# Patient Record
Sex: Male | Born: 1990 | Race: Black or African American | Hispanic: No | Marital: Single | Smoking: Current every day smoker
Health system: Southern US, Community
[De-identification: ages and names within clinical notes are randomized; demographics above are authoritative.]

---
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
## 2008-01-18 ENCOUNTER — Emergency Department (HOSPITAL_COMMUNITY): Admission: EM | Admit: 2008-01-18 | Discharge: 2008-01-18 | Payer: Self-pay | Admitting: Emergency Medicine

## 2008-04-07 ENCOUNTER — Encounter: Payer: Self-pay | Admitting: Orthopedic Surgery

## 2008-04-07 ENCOUNTER — Emergency Department (HOSPITAL_COMMUNITY): Admission: EM | Admit: 2008-04-07 | Discharge: 2008-04-07 | Payer: Self-pay | Admitting: Emergency Medicine

## 2008-04-10 ENCOUNTER — Ambulatory Visit: Payer: Self-pay | Admitting: Orthopedic Surgery

## 2008-04-10 DIAGNOSIS — S62319A Displaced fracture of base of unspecified metacarpal bone, initial encounter for closed fracture: Secondary | ICD-10-CM | POA: Insufficient documentation

## 2008-05-19 ENCOUNTER — Encounter: Payer: Self-pay | Admitting: Orthopedic Surgery

## 2008-12-09 ENCOUNTER — Emergency Department (HOSPITAL_COMMUNITY): Admission: EM | Admit: 2008-12-09 | Discharge: 2008-12-09 | Payer: Self-pay | Admitting: Emergency Medicine

## 2010-09-29 ENCOUNTER — Ambulatory Visit: Payer: Self-pay | Admitting: Orthopedic Surgery

## 2010-09-29 DIAGNOSIS — S62309A Unspecified fracture of unspecified metacarpal bone, initial encounter for closed fracture: Secondary | ICD-10-CM | POA: Insufficient documentation

## 2010-09-29 DIAGNOSIS — IMO0002 Reserved for concepts with insufficient information to code with codable children: Secondary | ICD-10-CM | POA: Insufficient documentation

## 2010-10-14 ENCOUNTER — Encounter: Payer: Self-pay | Admitting: Orthopedic Surgery

## 2011-01-01 ENCOUNTER — Emergency Department: Payer: Self-pay | Admitting: Unknown Physician Specialty

## 2011-01-04 NOTE — Letter (Signed)
Summary: History Form  History Form   Imported By: Jacklynn Ganong 10/04/2010 08:32:05  _____________________________________________________________________  External Attachment:    Type:   Image     Comment:   External Document

## 2011-01-04 NOTE — Letter (Signed)
Summary: *Orthopedic No Show Letter  [facility]  [address]  [place], [place] [address]   Phone: [phone]  Fax: [phone]      10/14/2010   Kerwin Augustus [address] [place]  [address]   Dear Mr. Dearing,   Our records indicate that you missed your scheduled appointment with Dr. Beaulah Corin on 10/14/2010.   Please contact this office to reschedule your appointment as soon as possible.  It is important that you keep your scheduled appointments with your physician, so we can provide you the best care possible.        Sincerely,   Dr. Terrance Mass, MD [facility] Phone [phone]
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
## 2013-02-13 ENCOUNTER — Emergency Department (HOSPITAL_COMMUNITY)
Admission: EM | Admit: 2013-02-13 | Discharge: 2013-02-13 | Disposition: A | Discharge status: Home / Self Care | Payer: No Typology Code available for payment source | Attending: Emergency Medicine | Admitting: Emergency Medicine

## 2013-02-13 ENCOUNTER — Emergency Department (HOSPITAL_COMMUNITY): Payer: No Typology Code available for payment source

## 2013-02-13 ENCOUNTER — Encounter (HOSPITAL_COMMUNITY): Payer: Self-pay

## 2013-02-13 DIAGNOSIS — S298XXA Other specified injuries of thorax, initial encounter: Secondary | ICD-10-CM | POA: Insufficient documentation

## 2013-02-13 DIAGNOSIS — F17200 Nicotine dependence, unspecified, uncomplicated: Secondary | ICD-10-CM | POA: Insufficient documentation

## 2013-02-13 DIAGNOSIS — S025XXA Fracture of tooth (traumatic), initial encounter for closed fracture: Secondary | ICD-10-CM | POA: Insufficient documentation

## 2013-02-13 DIAGNOSIS — Y92410 Unspecified street and highway as the place of occurrence of the external cause: Secondary | ICD-10-CM | POA: Insufficient documentation

## 2013-02-13 DIAGNOSIS — Y9389 Activity, other specified: Secondary | ICD-10-CM | POA: Insufficient documentation

## 2013-02-13 DIAGNOSIS — S8000XA Contusion of unspecified knee, initial encounter: Secondary | ICD-10-CM | POA: Insufficient documentation

## 2013-02-13 DIAGNOSIS — S46909A Unspecified injury of unspecified muscle, fascia and tendon at shoulder and upper arm level, unspecified arm, initial encounter: Secondary | ICD-10-CM | POA: Insufficient documentation

## 2013-02-13 MED ORDER — ACETAMINOPHEN-CODEINE #3 300-30 MG PO TABS: 1.0000 | ORAL_TABLET | Freq: Four times a day (QID) | ORAL | Status: DC | PRN

## 2013-02-13 MED ORDER — MELOXICAM 7.5 MG PO TABS: ORAL_TABLET | ORAL | Status: DC
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
## 2013-02-13 NOTE — ED Notes (Signed)
Pa bryant to see pt.  

## 2013-02-13 NOTE — ED Notes (Signed)
Pt returned from xray
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
## 2013-02-13 NOTE — ED Notes (Signed)
Pt given ginger ale before discharge and friend at bedside given 2nd cup of water. Ambulated without difficulty to lobby.

## 2013-02-13 NOTE — ED Provider Notes (Signed)
Medical screening examination/treatment/procedure(s) were performed by non-physician practitioner and as supervising physician I was immediately available for consultation/collaboration.   Joya Gaskins, MD 02/13/13 2043

## 2014-04-01 ENCOUNTER — Emergency Department: Payer: Self-pay | Admitting: Emergency Medicine

## 2016-08-09 ENCOUNTER — Encounter (HOSPITAL_COMMUNITY): Payer: Self-pay | Admitting: *Deleted

## 2016-08-09 ENCOUNTER — Emergency Department (HOSPITAL_COMMUNITY)
Admission: EM | Admit: 2016-08-09 | Discharge: 2016-08-09 | Disposition: A | Discharge status: Home / Self Care | Payer: Self-pay | Attending: Emergency Medicine | Admitting: Emergency Medicine

## 2016-08-09 ENCOUNTER — Emergency Department (HOSPITAL_COMMUNITY): Payer: Self-pay

## 2016-08-09 DIAGNOSIS — Y9364 Activity, baseball: Secondary | ICD-10-CM | POA: Insufficient documentation

## 2016-08-09 DIAGNOSIS — Y999 Unspecified external cause status: Secondary | ICD-10-CM | POA: Insufficient documentation

## 2016-08-09 DIAGNOSIS — Y929 Unspecified place or not applicable: Secondary | ICD-10-CM | POA: Insufficient documentation

## 2016-08-09 DIAGNOSIS — S63602A Unspecified sprain of left thumb, initial encounter: Secondary | ICD-10-CM | POA: Insufficient documentation

## 2016-08-09 DIAGNOSIS — X501XXA Overexertion from prolonged static or awkward postures, initial encounter: Secondary | ICD-10-CM | POA: Insufficient documentation

## 2016-08-09 DIAGNOSIS — F17210 Nicotine dependence, cigarettes, uncomplicated: Secondary | ICD-10-CM | POA: Insufficient documentation

## 2016-08-09 MED ORDER — TRAMADOL HCL 50 MG PO TABS: 50.0000 mg | ORAL_TABLET | Freq: Four times a day (QID) | ORAL | 0 refills | Status: DC | PRN

## 2016-08-09 MED ORDER — TRAMADOL HCL 50 MG PO TABS
100.0000 mg | ORAL_TABLET | Freq: Once | ORAL | Status: AC
Start: 2016-08-09 — End: 2016-08-09
  Administered 2016-08-09: 100 mg via ORAL
  Filled 2016-08-09: qty 2

## 2016-08-09 MED ORDER — ONDANSETRON HCL 4 MG PO TABS
4.0000 mg | ORAL_TABLET | Freq: Once | ORAL | Status: AC
  Administered 2016-08-09: 4 mg via ORAL
  Filled 2016-08-09: qty 1

## 2016-08-09 MED ORDER — IBUPROFEN 800 MG PO TABS
800.0000 mg | ORAL_TABLET | Freq: Once | ORAL | Status: AC
  Administered 2016-08-09: 800 mg via ORAL
  Filled 2016-08-09: qty 1

## 2016-08-09 MED ORDER — IBUPROFEN 800 MG PO TABS: 800.0000 mg | ORAL_TABLET | Freq: Three times a day (TID) | ORAL | 0 refills | Status: DC

## 2016-08-09 NOTE — ED Provider Notes (Signed)
[facility]PT Provider Note   CSN: [id number] Arrival date & time: 08/09/16  [id number]     History   Chief Complaint Chief Complaint  Patient presents with  . Hand Pain    HPI Jerome Rivera is a 25 y.o. male.  Patient is a 25 year old male who presents to the emergency department with complaint of left thumb and hand pain.  The patient states that on Wednesday, August 30 he was playing softball on. He went to dive for catch, landed on his hand and rolled over on his hand and injured the left hand and thumb on. He's been trying conservative measures since that time, but these have not been successful. He states now that the pain is getting worse. He had he can hold objects, but it hurts to grab them or to hold any pressure against his thumb for any given time. He has not noticed any dislocation, but states he has a significant amount of pain. No previous operation or procedure involving the thumb.      History reviewed. No pertinent past medical history.  Patient Active Problem List   Diagnosis Date Noted  . CLOSED FRACTURE METACARPAL BONE SITE UNSPECIFIED 09/29/2010  . CLOS FRACTURE MID/PROXIMAL PHALANX/PHALANG HAND 09/29/2010  . CLOSED FRACTURE OF BASE OF OTHER METACARPAL BONE 04/10/2008    History reviewed. No pertinent surgical history.     Home Medications    Prior to Admission medications   Medication Sig Start Date End Date Taking? Authorizing Provider  acetaminophen-codeine (TYLENOL #3) 300-30 MG per tablet Take 1-2 tablets by mouth every 6 (six) hours as needed for pain. 02/13/13   Ivery QualeHobson Cashton Hosley, PA-C  meloxicam (MOBIC) 7.5 MG tablet 1 po bid with food 02/13/13   Ivery QualeHobson Lillyanne Bradburn, PA-C    Family History No family history on file.  Social History Social History  Substance Use Topics  . Smoking status: Current Every Day Smoker    Packs/day: 0.50    Types: Cigarettes  . Smokeless tobacco: Never Used  . Alcohol use No     Allergies   Review of  patient's allergies indicates no known allergies.   Review of Systems Review of Systems  Musculoskeletal: Positive for arthralgias.  All other systems reviewed and are negative.    Physical Exam Updated Vital Signs BP 137/79 (BP Location: Left Arm)   Pulse 66   Temp 98.2 F (36.8 C) (Oral)   Resp 16   Ht 5\' 6"  (1.676 m)   Wt 81.6 kg   SpO2 98%   BMI 29.05 kg/m   Physical Exam  Constitutional: Vital signs are normal. He appears well-developed and well-nourished. He is active.  HENT:  Head: Normocephalic and atraumatic.  Right Ear: Tympanic membrane, external ear and ear canal normal.  Left Ear: Tympanic membrane, external ear and ear canal normal.  Nose: Nose normal.  Mouth/Throat: Uvula is midline, oropharynx is clear and moist and mucous membranes are normal.  Eyes: Conjunctivae, EOM and lids are normal. Pupils are equal, round, and reactive to light.  Neck: Trachea normal, normal range of motion and phonation normal. Neck supple. Carotid bruit is not present.  Cardiovascular: Normal rate, regular rhythm and normal pulses.   Abdominal: Soft. Normal appearance and bowel sounds are normal.  Musculoskeletal:  There is full range of motion of the left shoulder and elbow. There's no deformity of the left forearm. There is good range of motion of the left wrist without problem. There is pain with flexion and extension of the  left thumb, particularly at the MP joint area extending into the thenar eminence. There is no tenderness in the anatomical snuffbox. The thumb is not hot, and the wrist is not hot. Radial pulses 2+.  Lymphadenopathy:       Head (right side): No submental, no preauricular and no posterior auricular adenopathy present.       Head (left side): No submental, no preauricular and no posterior auricular adenopathy present.    He has no cervical adenopathy.  Neurological: He is alert. He has normal strength. No cranial nerve deficit or sensory deficit. He exhibits  normal muscle tone. Coordination normal. GCS eye subscore is 4. GCS verbal subscore is 5. GCS motor subscore is 6.  Skin: Skin is warm and dry.  Psychiatric: His speech is normal.     ED Treatments / Results  Labs (all labs ordered are listed, but only abnormal results are displayed) Labs Reviewed - No data to display  EKG  EKG Interpretation None       Radiology Dg Hand Complete Left  Result Date: 08/09/2016 CLINICAL DATA:  Left thumb index finger pain in metacarpal area. Softball injury. EXAM: LEFT HAND - COMPLETE 3+ VIEW COMPARISON:  None. FINDINGS: There is no evidence of fracture or dislocation. There is no evidence of arthropathy or other focal bone abnormality. Soft tissues are unremarkable. IMPRESSION: Negative. Electronically Signed   By: Charlett Nose M.D.   On: 08/09/2016 09:47    Procedures Procedures (including critical care time)  Medications Ordered in ED Medications - No data to display   Initial Impression / Assessment and Plan / ED Course  I have reviewed the triage vital signs and the nursing notes.  Pertinent labs & imaging results that were available during my care of the patient were reviewed by me and considered in my medical decision making (see chart for details).  Clinical Course    *I have reviewed nursing notes, vital signs, and all appropriate lab and imaging results for this patient.**  Final Clinical Impressions(s) / ED Diagnoses  Vital signs within normal limits. X-ray of the left hand is negative for fracture or dislocation. The examination favors a sprain involving the left thumb. The patient is too uncomfortable to allow adequate examination for ligament injury. The patient is fitted with a thumb spica splint. He will be treated with ibuprofen and Ultram for discomfort. He is referred to Dr. Romeo Apple for orthopedic evaluation and to rule out ligament injury to his left thumb. Patient is in agreement with this discharge plan.    Final  diagnoses:  None    New Prescriptions New Prescriptions   No medications on file     Ivery Quale, PA-C 08/09/16 1127    Rolland Porter, MD 08/16/16 2351

## 2016-08-09 NOTE — ED Triage Notes (Signed)
Pt states he was playing softball last Wednesday when he went to catch the ball but landed on his left hand. Pt states his thumb is primary source of pain but this pain moves into his palm. Minimal swelling is noted. Pt denies taking any pain medication.

## 2016-08-09 NOTE — Discharge Instructions (Signed)
Your x-ray is negative for fracture. I am concerned for possible ligament injury on involving your thumb and wrist. Please see Dr. Romeo AppleHarrison for orthopedic evaluation. Please usual thumb splint until seen by orthopedics. Use ibuprofen 3 times daily with a meal. May use Ultram for more severe pain.This medication may cause drowsiness. Please do not drink, drive, or participate in activity that requires concentration while taking this medication.

## 2016-08-09 NOTE — ED Notes (Signed)
Jerome Rivera at bedside 

## 2016-08-10 ENCOUNTER — Ambulatory Visit: Payer: Self-pay | Admitting: Orthopaedic Surgery

## 2016-08-11 ENCOUNTER — Ambulatory Visit (INDEPENDENT_AMBULATORY_CARE_PROVIDER_SITE_OTHER): Payer: BLUE CROSS/BLUE SHIELD | Admitting: Orthopaedic Surgery

## 2016-08-11 ENCOUNTER — Encounter: Payer: Self-pay | Admitting: Orthopaedic Surgery

## 2016-08-11 VITALS — BP 135/79 | HR 56 | Temp 97.7°F | Ht 67.0 in | Wt 178.0 lb

## 2016-08-11 DIAGNOSIS — F17200 Nicotine dependence, unspecified, uncomplicated: Secondary | ICD-10-CM

## 2016-08-11 DIAGNOSIS — S66912A Strain of unspecified muscle, fascia and tendon at wrist and hand level, left hand, initial encounter: Secondary | ICD-10-CM | POA: Diagnosis not present

## 2016-08-11 DIAGNOSIS — IMO0001 Reserved for inherently not codable concepts without codable children: Secondary | ICD-10-CM

## 2016-08-11 DIAGNOSIS — Z720 Tobacco use: Secondary | ICD-10-CM | POA: Diagnosis not present

## 2016-08-11 MED ORDER — HYDROCODONE-ACETAMINOPHEN 5-325 MG PO TABS: 1.0000 | ORAL_TABLET | ORAL | 0 refills | Status: DC | PRN

## 2016-08-11 MED ORDER — NAPROXEN 500 MG PO TABS: 500.0000 mg | ORAL_TABLET | Freq: Two times a day (BID) | ORAL | 5 refills | Status: DC

## 2016-08-11 NOTE — Progress Notes (Signed)
Subjective: I hurt my left hand and thumb last Wednesday    Patient ID: Jerome Rivera, male    DOB: [birth year], 25 y.o.   MRN: [id number]  HPI He was playing ball last week.  He had a glove on his left hand.  He missed the ball and fell and landed on his left hand more to the thumb side and hyperextended the thumb.  He has had pain in the thumb since then.  This happened on 08-03-16.  He went to the ER on 08-09-16.  X-rays were done and were negative.  He still has pain and tenderness of the thumb.  He has no other injury.  Rest, heat, ice has not helped that much.   He has a thumb splint which really helps a lot.  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   Review of Systems  HENT: Negative for congestion.   Respiratory: Negative for cough and shortness of breath.   Cardiovascular: Negative for chest pain and leg swelling.  Endocrine: Negative for cold intolerance.  Musculoskeletal: Positive for arthralgias and joint swelling.  Allergic/Immunologic: Negative for environmental allergies.   History reviewed. No pertinent past medical history.  History reviewed. No pertinent surgical history.  No current outpatient prescriptions on file prior to visit.   No current facility-administered medications on file prior to visit.     Social History   Social History  . Marital status: Single    Spouse name: N/A  . Number of children: N/A  . Years of education: N/A   Occupational History  . Not on file.   Social History Main Topics  . Smoking status: Current Every Day Smoker    Packs/day: 0.50    Types: Cigarettes  . Smokeless tobacco: Never Used  . Alcohol use No  . Drug use: No  . Sexual activity: Not on file   Other Topics Concern  . Not on file   Social History Narrative  . No narrative on file    Family History  Problem Relation Age of Onset  . Arthritis Mother   . Diabetes Father     BP 135/79   Pulse (!) 56   Temp 97.7 F  (36.5 C)   Ht 5\' 7"  (1.702 m)   Wt 178 lb (80.7 kg)   BMI 27.88 kg/m      Objective:   Physical Exam  Constitutional: He is oriented to person, place, and time. He appears well-developed and well-nourished.  HENT:  Head: Normocephalic and atraumatic.  Eyes: Conjunctivae and EOM are normal. Pupils are equal, round, and reactive to light.  Neck: Normal range of motion. Neck supple.  Cardiovascular: Normal rate, regular rhythm and intact distal pulses.   Pulmonary/Chest: Effort normal.  Abdominal: Soft.  Musculoskeletal: He exhibits tenderness (Pain left thumb over the ulnar collateral ligament, stable.  Slight swelling here.  Grip decreased.  NV intact.  Right hand negative.).  Neurological: He is alert and oriented to person, place, and time. He has normal reflexes. He displays normal reflexes. No cranial nerve deficit. He exhibits normal muscle tone. Coordination normal.  Skin: Skin is warm and dry.  Psychiatric: He has a normal mood and affect. His behavior is normal. Judgment and thought content normal.   He smokes.  I have asked him to cut back or quit. He knows he should.       Assessment & Plan:   Encounter Diagnoses  Name Primary?  Marland Kitchen  Strain of left thumb, initial encounter Yes  . Tobacco smoker within last 12 months    I have explained about a strain of the thumb.  He has stability of the thumb but has a strain of the ulnar collateral ligament.  I have recommended he continue the splint.  I have called in Naprosyn for him.  Stay out of work.  Return in two weeks.  Call if any problem.  Precautions discussed.  Electronically Signed Darreld McleanWayne Aaleeyah Bias, MD 9/7/20179:25 AM

## 2016-08-11 NOTE — Patient Instructions (Addendum)
Give note out of work for the next two weeks.     Smoking Cessation, Tips for Success If you are ready to quit smoking, congratulations! You have chosen to help yourself be healthier. Cigarettes bring nicotine, tar, carbon monoxide, and other irritants into your body. Your lungs, heart, and blood vessels will be able to work better without these poisons. There are many different ways to quit smoking. Nicotine gum, nicotine patches, a nicotine inhaler, or nicotine nasal spray can help with physical craving. Hypnosis, support groups, and medicines help break the habit of smoking. WHAT THINGS CAN I DO TO MAKE QUITTING EASIER?  Here are some tips to help you quit for good:  Pick a date when you will quit smoking completely. Tell all of your friends and family about your plan to quit on that date.  Do not try to slowly cut down on the number of cigarettes you are smoking. Pick a quit date and quit smoking completely starting on that day.  Throw away all cigarettes.   Clean and remove all ashtrays from your home, work, and car.  On a card, write down your reasons for quitting. Carry the card with you and read it when you get the urge to smoke.  Cleanse your body of nicotine. Drink enough water and fluids to keep your urine clear or pale yellow. Do this after quitting to flush the nicotine from your body.  Learn to predict your moods. Do not let a bad situation be your excuse to have a cigarette. Some situations in your life might tempt you into wanting a cigarette.  Never have "just one" cigarette. It leads to wanting another and another. Remind yourself of your decision to quit.  Change habits associated with smoking. If you smoked while driving or when feeling stressed, try other activities to replace smoking. Stand up when drinking your coffee. Brush your teeth after eating. Sit in a different chair when you read the paper. Avoid alcohol while trying to quit, and try to drink fewer  caffeinated beverages. Alcohol and caffeine may urge you to smoke.  Avoid foods and drinks that can trigger a desire to smoke, such as sugary or spicy foods and alcohol.  Ask people who smoke not to smoke around you.  Have something planned to do right after eating or having a cup of coffee. For example, plan to take a walk or exercise.  Try a relaxation exercise to calm you down and decrease your stress. Remember, you may be tense and nervous for the first 2 weeks after you quit, but this will pass.  Find new activities to keep your hands busy. Play with a pen, coin, or rubber band. Doodle or draw things on paper.  Brush your teeth right after eating. This will help cut down on the craving for the taste of tobacco after meals. You can also try mouthwash.   Use oral substitutes in place of cigarettes. Try using lemon drops, carrots, cinnamon sticks, or chewing gum. Keep them handy so they are available when you have the urge to smoke.  When you have the urge to smoke, try deep breathing.  Designate your home as a nonsmoking area.  If you are a heavy smoker, ask your health care provider about a prescription for nicotine chewing gum. It can ease your withdrawal from nicotine.  Reward yourself. Set aside the cigarette money you save and buy yourself something nice.  Look for support from others. Join a support group or smoking cessation  program. Ask someone at home or at work to help you with your plan to quit smoking.  Always ask yourself, "Do I need this cigarette or is this just a reflex?" Tell yourself, "Today, I choose not to smoke," or "I do not want to smoke." You are reminding yourself of your decision to quit.  Do not replace cigarette smoking with electronic cigarettes (commonly called e-cigarettes). The safety of e-cigarettes is unknown, and some may contain harmful chemicals.  If you relapse, do not give up! Plan ahead and think about what you will do the next time you get  the urge to smoke. HOW WILL I FEEL WHEN I QUIT SMOKING? You may have symptoms of withdrawal because your body is used to nicotine (the addictive substance in cigarettes). You may crave cigarettes, be irritable, feel very hungry, cough often, get headaches, or have difficulty concentrating. The withdrawal symptoms are only temporary. They are strongest when you first quit but will go away within 10-14 days. When withdrawal symptoms occur, stay in control. Think about your reasons for quitting. Remind yourself that these are signs that your body is healing and getting used to being without cigarettes. Remember that withdrawal symptoms are easier to treat than the major diseases that smoking can cause.  Even after the withdrawal is over, expect periodic urges to smoke. However, these cravings are generally short lived and will go away whether you smoke or not. Do not smoke! WHAT RESOURCES ARE AVAILABLE TO HELP ME QUIT SMOKING? Your health care provider can direct you to community resources or hospitals for support, which may include:  Group support.  Education.  Hypnosis.  Therapy.   This information is not intended to replace advice given to you by your health care provider. Make sure you discuss any questions you have with your health care provider.   Document Released: 08/19/2004 Document Revised: 12/12/2014 Document Reviewed: 05/09/2013 Elsevier Interactive Patient Education [employer].

## 2016-08-25 ENCOUNTER — Ambulatory Visit (INDEPENDENT_AMBULATORY_CARE_PROVIDER_SITE_OTHER): Payer: BLUE CROSS/BLUE SHIELD | Admitting: Orthopaedic Surgery

## 2016-08-25 ENCOUNTER — Encounter: Payer: Self-pay | Admitting: Orthopaedic Surgery

## 2016-08-25 VITALS — BP 119/80 | HR 76 | Temp 97.9°F | Ht 67.0 in | Wt 179.0 lb

## 2016-08-25 DIAGNOSIS — S66912A Strain of unspecified muscle, fascia and tendon at wrist and hand level, left hand, initial encounter: Secondary | ICD-10-CM

## 2016-08-25 DIAGNOSIS — IMO0001 Reserved for inherently not codable concepts without codable children: Secondary | ICD-10-CM

## 2016-08-25 MED ORDER — HYDROCODONE-ACETAMINOPHEN 5-325 MG PO TABS: 1.0000 | ORAL_TABLET | ORAL | 0 refills | Status: DC | PRN

## 2016-08-25 NOTE — Progress Notes (Signed)
Patient ZO:XWRUEAVW:Jerome Rivera, male DOB:06/21/1991, [age] y.o. [id number]  Chief Complaint  Patient presents with  . Follow-up    left thumb injury    HPI  Jerome Rivera is a 25 y.o. male who has injury to the left thumb.  He has been using his splint.  He has very little pain now, no instability.  He can return to work this Sunday/Monday.  Precautions discussed. HPI  Body mass index is 28.04 kg/m.  ROS  Review of Systems  HENT: Negative for congestion.   Respiratory: Negative for cough and shortness of breath.   Cardiovascular: Negative for chest pain and leg swelling.  Endocrine: Negative for cold intolerance.  Musculoskeletal: Positive for arthralgias and joint swelling.  Allergic/Immunologic: Negative for environmental allergies.    No past medical history on file.  No past surgical history on file.  Family History  Problem Relation Age of Onset  . Arthritis Mother   . Diabetes Father     Social History Social History  Substance Use Topics  . Smoking status: Current Every Day Smoker    Packs/day: 0.50    Types: Cigarettes  . Smokeless tobacco: Never Used  . Alcohol use No    No Known Allergies  Current Outpatient Prescriptions  Medication Sig Dispense Refill  . HYDROcodone-acetaminophen (NORCO/VICODIN) 5-325 MG tablet Take 1 tablet by mouth every 4 (four) hours as needed for moderate pain (Must last 14 days.Do not take and drive a car or use machinery.). 56 tablet 0  . naproxen (NAPROSYN) 500 MG tablet Take 1 tablet (500 mg total) by mouth 2 (two) times daily with a meal. 60 tablet 5   No current facility-administered medications for this visit.      Physical Exam  Blood pressure 119/80, pulse 76, temperature 97.9 F (36.6 C), height 5\' 7"  (1.702 m), weight 179 lb (81.2 kg).  Constitutional: overall normal hygiene, normal nutrition, well developed, normal grooming, normal body habitus. Assistive device:thumb splint  Musculoskeletal: gait  and station Limp none, muscle tone and strength are normal, no tremors or atrophy is present.  .  Neurological: coordination overall normal.  Deep tendon reflex/nerve stretch intact.  Sensation normal.  Cranial nerves II-XII intact.   Skin:   Normal overall no scars, lesions, ulcers or rashes. No psoriasis.  Psychiatric: Alert and oriented x 3.  Recent memory intact, remote memory unclear.  Normal mood and affect. Well groomed.  Good eye contact.  Cardiovascular: overall no swelling, no varicosities, no edema bilaterally, normal temperatures of the legs and arms, no clubbing, cyanosis and good capillary refill.  Lymphatic: palpation is normal.  His left thumb is stable.  He has no pain of the ulnar collateral ligament of the thumb on the left and it is not swollen.  NV intact. ROM full.  The patient has been educated about the nature of the problem(s) and counseled on treatment options.  The patient appeared to understand what I have discussed and is in agreement with it.  Encounter Diagnosis  Name Primary?  . Strain of left thumb, initial encounter Yes    PLAN Call if any problems.  Precautions discussed.  Continue current medications.   Return to clinic 3 weeks.  Call and cancel if continuing to do well.   Electronically Signed Darreld McleanWayne Marcelia Petersen, MD 9/21/20179:30 AM

## 2016-08-25 NOTE — Patient Instructions (Signed)
Return to work Sunday/Monday

## 2016-09-15 ENCOUNTER — Encounter: Payer: Self-pay | Admitting: Orthopaedic Surgery

## 2016-09-15 ENCOUNTER — Ambulatory Visit: Payer: BLUE CROSS/BLUE SHIELD | Admitting: Orthopaedic Surgery

## 2018-09-22 ENCOUNTER — Other Ambulatory Visit: Payer: Self-pay

## 2018-09-22 ENCOUNTER — Emergency Department (HOSPITAL_COMMUNITY)
Admission: EM | Admit: 2018-09-22 | Discharge: 2018-09-22 | Disposition: A | Discharge status: Home / Self Care | Payer: BLUE CROSS/BLUE SHIELD | Attending: Emergency Medicine | Admitting: Emergency Medicine

## 2018-09-22 ENCOUNTER — Encounter (HOSPITAL_COMMUNITY): Payer: Self-pay

## 2018-09-22 DIAGNOSIS — Z79899 Other long term (current) drug therapy: Secondary | ICD-10-CM | POA: Insufficient documentation

## 2018-09-22 DIAGNOSIS — T7840XA Allergy, unspecified, initial encounter: Secondary | ICD-10-CM | POA: Insufficient documentation

## 2018-09-22 DIAGNOSIS — F17210 Nicotine dependence, cigarettes, uncomplicated: Secondary | ICD-10-CM | POA: Insufficient documentation

## 2018-09-22 DIAGNOSIS — T63451A Toxic effect of venom of hornets, accidental (unintentional), initial encounter: Secondary | ICD-10-CM | POA: Diagnosis present

## 2018-09-22 MED ORDER — PREDNISONE 20 MG PO TABS: 40.0000 mg | ORAL_TABLET | Freq: Every day | ORAL | 0 refills | Status: DC

## 2018-09-22 MED ORDER — PREDNISONE 20 MG PO TABS
40.0000 mg | ORAL_TABLET | Freq: Once | ORAL | Status: AC
Start: 2018-09-22 — End: 2018-09-22
  Administered 2018-09-22: 40 mg via ORAL
  Filled 2018-09-22: qty 2

## 2018-09-22 MED ORDER — DIPHENHYDRAMINE HCL 25 MG PO CAPS
50.0000 mg | ORAL_CAPSULE | Freq: Once | ORAL | Status: AC
  Administered 2018-09-22: 50 mg via ORAL
  Filled 2018-09-22: qty 2

## 2018-09-22 NOTE — Discharge Instructions (Addendum)
You have been diagnosed with an allergic reaction. Usually allergic reactions like this are caused by exposures to something that you either ate or touched or smelled.  It may be related to a number of different exposures including a new perfume, topical creams, soaps, detergents, linens, clothing, medications. Occasionally we do not find an answer for why there is an allergic reaction. These are treated the same way including Benadryl as needed for itching and rash. (This can be used up to 50 mg every 6 hours as needed).  Pepcid 20 mg every night and prednisone once a day for 5 days. Please do not take the Benadryl and drive or take care of children or other imported duties as the Benadryl can make you sleepy.   ° °If you should develop severe or worsening symptoms including difficulty breathing, difficulty swallowing, wheezing or increased coughing or a rash that developed on the inside of your mouth or a worsening rash on your skin, return to the hospital immediately for a recheck. Please call your Dr. in the morning for a recheck in 2 days if you are still having symptoms. If you do not have a Dr. see the list below.  If we have identified the source of your allergic reaction, please avoid this at all costs. This means stopping the medication if it is a new medication or a voiding topical exposures such as creams lotions body soaps or deodorants if this is the source. ° °Allergic Reaction, Mild to Moderate °Allergies may happen from anything your body is sensitive to. This may be food, medications, pollens, chemicals, and nearly anything around you in everyday life that produces allergens. An allergen is anything that causes an allergy producing substance. Allergens cause your body to release allergic antibodies. Through a chain of events, they cause a release of histamine into the blood stream. Histamines are meant to protect you, but they also cause your discomfort. This is why antihistamines are often used  for allergies. Heredity is often a factor in causing allergic reactions. This means you may have some of the same allergies as your parents. °Allergies happen in all age groups. You may have some idea of what caused your reaction. There are many allergens around us. It may be difficult to know what caused your reaction. If this is a first time event, it may never happen again. Allergies cannot be cured but can be controlled with medications. °SYMPTOMS  °You may get some or all of the following problems from allergies. °· Swelling and itching in and around the mouth.  °· Tearing, itchy eyes.  °· Nasal congestion and runny nose.  °· Sneezing and coughing.  °· An itchy red rash or hives.  °· Vomiting or diarrhea.  °· Difficulty breathing.  °Seasonal allergies occur in all age groups. They are seasonal because they usually occur during the same season every year. They may be a reaction to molds, grass pollens, or tree pollens. Other causes of allergies are house dust mite allergens, pet dander and mold spores. These are just a common few of the thousands of allergens around us. All of the symptoms listed above happen when you come in contact with pollens and other allergens. Seasonal allergies are usually not life threatening. They are generally more of a nuisance that can often be handled using medications. °Hay fever is a combination of all or some of the above listed allergy problems. It may often be treated with simple over-the-counter medications such as diphenhydramine. Take medication as   directed. Check with your caregiver or package insert for child dosages. °TREATMENT AND HOME CARE INSTRUCTIONS °If hives or rash are present: °· Take medications as directed.  °· You may use an over-the-counter antihistamine (diphenhydramine) for hives and itching as needed. Do not drive or drink alcohol until medications used to treat the reaction have worn off. Antihistamines tend to make people sleepy.  °· Apply cold cloths  (compresses) to the skin or take baths in cool water. This will help itching. Avoid hot baths or showers. Heat will make a rash and itching worse.  °· If your allergies persist and become more severe, and over the counter medications are not effective, there are many new medications your caretaker can prescribe. Immunotherapy or desensitizing injections can be used if all else fails. Follow up with your caregiver if problems continue.  °SEEK MEDICAL CARE IF:  °· Your allergies are becoming progressively more troublesome.  °· You suspect a food allergy. Symptoms generally happen within 30 minutes of eating a food.  °· Your symptoms have not gone away within 2 days or are getting worse.  °· You develop new symptoms.  °· You want to retest yourself or your child with a food or drink you think causes an allergic reaction. Never test yourself or your child of a suspected allergy without being under the watchful eye of your caregivers. A second exposure to an allergen may be life-threatening.  °SEEK IMMEDIATE MEDICAL CARE IF: °· You develop difficulty breathing or wheezing, or have a tight feeling in your chest or throat.  °· You develop a swollen mouth, hives, swelling, or itching all over your body.  °A severe reaction with any of the above problems should be considered life-threatening. If you suddenly develop difficulty breathing call for local emergency medical help. THIS IS AN EMERGENCY. °MAKE SURE YOU:  °· Understand these instructions.  °· Will watch your condition.  °· Will get help right away if you are not doing well or get worse.  °Document Released: 09/18/2007 Document Revised: 11/10/2011 Document Reviewed: 09/18/2007 °ExitCare® Patient Information ©2012 [employer]. ° °RESOURCE GUIDE ° °Chronic Pain Problems: °Contact [facility]  [phone] °Patients need to be referred by their primary care doctor. ° °Insufficient Money for Medicine: °Contact [employer] Way:  call "211" or [facility] 271-5999. ° °No Primary Care Doctor: °- Call [facility]  832-8000 - can help you locate a primary care doctor that  accepts your insurance, provides certain services, etc. °- Physician Referral Service- 1-800-533-3463 ° °Agencies that provide inexpensive medical care: °- [facility]  [phone] °- [facility]  [phone] °- [facility]  [phone] °- [facility]  832-4777 °- [facility]  [phone] °- [facility]  [phone] ° °Medicaid-accepting [place] Providers: °- [facility]- [address] ° [phone], Mon-Fri 9am-7pm, Sat 9am-1pm °- [facility]- [address], [address] ° [phone] °- [facility]- [address], [address] ° [phone] °- [facility]- [address] [place] [address] ° [phone] °- [facility]- [address], [phone] ° Only accepts [place] Access Medicaid patients after they have their name  applied to their card ° °Self Pay (no insurance) in [place]: °- Sickle Cell Patients: Dr Eric Dean, [facility] ° [address], [phone] °-  [facility]- [address] °   [phone] °      -     [facility]- [address] °      -     [facility]- see information above (Speak to Pam H if you do not have insurance) °      -  [facility]- [address], [phone] °      -  [facility] [place]- [address],  [phone] °      -  [facility]- [address] [place] [address], [phone] °      -  Dr Osei-Bonsu-  [address], [place], [phone] °      -  [facility]- [address], [phone] °      -  [facility]- [address] [place] [address], [phone], also [address], [phone] °      -    [facility]- [address], [phone], 1st & 3rd Saturday   every month,  10am-1pm ° °1) Find a Doctor and Pay Out of Pocket °Although you won't have to find out who is covered by your insurance plan, it is a good idea to ask around and get recommendations. You will then need to call the office and see if the doctor you have chosen will accept you as a new patient and what types of options they offer for patients who are self-pay. Some doctors offer discounts or will set up payment plans for their patients who do not have insurance, but you will need to ask so you aren't surprised when you get to your appointment. ° °2) Contact Your Local Health Department °Not all health departments have doctors that can see patients for sick visits, but many do, so it is worth a call to see if yours does. If you don't know where your local health department is, you can check in your phone book. The CDC also has a tool to help you locate your state's health department, and many state websites also have listings of all of their local health departments. ° °3) Find a Walk-in Clinic °If your illness is not likely to be very severe or complicated, you may want to try a walk in clinic. These are popping up all over the country in pharmacies, drugstores, and shopping centers. They're usually staffed by nurse practitioners or physician assistants that have been trained to treat common illnesses and complaints. They're usually fairly quick and inexpensive. However, if you have serious medical issues or chronic medical problems, these are probably not your best option ° °STD Testing °- [facility] [place], [address], [place], phone [phone] or 1-877-539-9860.  Monday - Friday, call for an appointment. °- [place] [place], [facility], [address], [place], phone [phone] or 1-877-539-9860.  Monday - Friday, call for an appointment. ° °Abuse/Neglect: °- [place] Child Abuse Hotline (336)  641-3795 °- [place] Child Abuse Hotline 800-378-5315 (After Hours) ° °Emergency Shelter:  [facility] (336) 271-5985 ° °Maternity Homes: °- [facility] (336) 275-9566 °- [facility] (704) 372-4663 ° °MRSA Hotline #:   832-7006 ° °[place] Resources ° °[facility] [place]  [employer] [place] [facility] °[address]                   [address]         [address]  °[place]                                               [place]                              [place] °Phone:  349-3220                                  Phone:  [phone]                   Phone:  342-8140 ° °[facility], 342-8316 °- [place] [facility] - [facility]- 1-888-581-9988 °      -     [facility] in [place], [address],                                  336-349-4454, Insurance ° °[place] Child Abuse Hotline °(336) 342-1394 or (336) 342-3537 (After Hours) ° ° °Behavioral Health Services ° °Substance Abuse Resources: °- [facility]  336-882-2125 °- [facility] 336-784-9470 °- [facility] 336-285-9073 °- [facility] 336-845-3988 °- Residential & Outpatient Substance Abuse Program  800-659-3381 ° °Psychological Services: °- [facility]  832-9600 °- [facility]  378-7881 °- [facility], [address], [place], ACCESS LINE: 1-800-853-5163 or 336-641-4981, Http://[email or web address]/adult.htm ° °Dental Assistance ° °If unable to pay or uninsured, contact:  [facility] or [facility] to become qualified for the adult dental clinic. ° °Patients with Medicaid: [facility] °[address], [phone] °[address], [phone] ° °If unable to pay, or uninsured, contact [facility] (271-5999) or [place] [facility] (641-3152 in [place], 842-7733 in [place]) to become qualified for the adult dental clinic ° °Other Low-Cost Community Dental Services: °- [facility]- [address], [place], [place], [address], 723-1848, [address], 2nd and 4th Thursday of the month at 6:30am.  10 clients each day by appointment, can sometimes see walk-in patients if someone does not show for an appointment. °- [facility]- [address], [place], [place], [address], 723-7904 °- [facility]- [address], [place], [place], [address], [phone] °- [facility]- 342-8273 °- [facility]- [phone] °- [facility]- 570-6415 ° ° ° ° ° °

## 2018-09-22 NOTE — ED Triage Notes (Signed)
Pt was outside working today and was stung by a lot of [place] hornets. Pt was very lethargic when EMS arrived. Pt refused IV access with EMS. Was kept on O2 en route here. Alert and oriented. NAD. Not allergic to anything.

## 2018-09-22 NOTE — ED Provider Notes (Signed)
[facility] EMERGENCY DEPARTMENT Provider Note   CSN: [id number] Arrival date & time: 09/22/18  1218     History   Chief Complaint Chief Complaint  Patient presents with  . Allergic Reaction    HPI Jerome Rivera is a 27 y.o. male.  HPI  27 year old male, denies any chronic medical problems, states that he takes no medicines has no allergies and was in his usual state of health when he was working on fixing a cattle fence approximately 1 hour ago, he states that when he removed a tree limb to try to fix the fence it disturbed a nest of some type of flying insect that stung him multiple times, he was stung in the forehead, the left cheek, the right arm, this caused immediate pain, he felt wheezy short of breath and lightheaded.  He was also nauseated.  He called for paramedic transport when they arrived the patient was refusing an IV and stating that he was already feeling better.  At this time he states that he has a little bit of discomfort at the sting sites but has no other symptoms at all.  No abdominal pain chest pain coughing swelling of the legs and denies any urticaria or itching at this time.  He denies any prior history of insect allergies.  History reviewed. No pertinent past medical history.  Patient Active Problem List   Diagnosis Date Noted  . CLOSED FRACTURE METACARPAL BONE SITE UNSPECIFIED 09/29/2010  . CLOS FRACTURE MID/PROXIMAL PHALANX/PHALANG HAND 09/29/2010  . CLOSED FRACTURE OF BASE OF OTHER METACARPAL BONE 04/10/2008    History reviewed. No pertinent surgical history.      Home Medications    Prior to Admission medications   Medication Sig Start Date End Date Taking? Authorizing Provider  HYDROcodone-acetaminophen (NORCO/VICODIN) 5-325 MG tablet Take 1 tablet by mouth every 4 (four) hours as needed for moderate pain (Must last 14 days.Do not take and drive a car or use machinery.). 08/25/16   Darreld Mclean, MD  naproxen (NAPROSYN) 500 MG tablet  Take 1 tablet (500 mg total) by mouth 2 (two) times daily with a meal. 08/11/16   Darreld Mclean, MD  predniSONE (DELTASONE) 20 MG tablet Take 2 tablets (40 mg total) by mouth daily. 09/22/18   Eber Hong, MD    Family History Family History  Problem Relation Age of Onset  . Arthritis Mother   . Diabetes Father     Social History Social History   Tobacco Use  . Smoking status: Current Every Day Smoker    Packs/day: 0.50    Types: Cigarettes  . Smokeless tobacco: Never Used  Substance Use Topics  . Alcohol use: No  . Drug use: No     Allergies   Patient has no known allergies.   Review of Systems Review of Systems  All other systems reviewed and are negative.    Physical Exam Updated Vital Signs BP 125/67 (BP Location: Right Arm)   Pulse 86   Temp 97.7 F (36.5 C) (Oral)   Resp 18   Ht 1.676 m (5\' 6" )   Wt 86.2 kg   SpO2 95%   BMI 30.67 kg/m   Physical Exam  Constitutional: He appears well-developed and well-nourished. No distress.  HENT:  Head: Normocephalic and atraumatic.  Mouth/Throat: Oropharynx is clear and moist. No oropharyngeal exudate.  Eyes: Pupils are equal, round, and reactive to light. Conjunctivae and EOM are normal. Right eye exhibits no discharge. Left eye exhibits no discharge. No scleral  icterus.  Neck: Normal range of motion. Neck supple. No JVD present. No thyromegaly present.  Cardiovascular: Normal rate, regular rhythm, normal heart sounds and intact distal pulses. Exam reveals no gallop and no friction rub.  No murmur heard. Pulmonary/Chest: Effort normal and breath sounds normal. No respiratory distress. He has no wheezes. He has no rales.  Abdominal: Soft. Bowel sounds are normal. He exhibits no distension and no mass. There is no tenderness.  Musculoskeletal: Normal range of motion. He exhibits no edema or tenderness.  Lymphadenopathy:    He has no cervical adenopathy.  Neurological: He is alert. Coordination normal.  Skin:  Skin is warm and dry. No rash noted. No erythema.  Psychiatric: He has a normal mood and affect. His behavior is normal.  Nursing note and vitals reviewed.    ED Treatments / Results  Labs (all labs ordered are listed, but only abnormal results are displayed) Labs Reviewed - No data to display  EKG None  Radiology No results found.  Procedures Procedures (including critical care time)  Medications Ordered in ED Medications  predniSONE (DELTASONE) tablet 40 mg (40 mg Oral Given 09/22/18 1346)  diphenhydrAMINE (BENADRYL) capsule 50 mg (50 mg Oral Given 09/22/18 1347)     Initial Impression / Assessment and Plan / ED Course  I have reviewed the triage vital signs and the nursing notes.  Pertinent labs & imaging results that were available during my care of the patient were reviewed by me and considered in my medical decision making (see chart for details).     The patient's exam is unremarkable, there is no urticaria, there is no redness tenderness or swelling, his oropharynx is clear and moist without any obstructive swelling, phonation is normal, no wheezing, no distress, no nausea.  He clearly describes symptoms of having a fairly severe reaction but for some reason this has improved, he denies getting any medication prehospital.  We will give a dose of Benadryl and prednisone, watch for an hour, the patient refuses to stay longer than 1 hour.  Improved with m eds - no sx - requesting d/c givern pt the instructions for return, he is agreeable Stable at this time No airway issues, no rash, no sx.  Final Clinical Impressions(s) / ED Diagnoses   Final diagnoses:  Allergic reaction, initial encounter    ED Discharge Orders         Ordered    predniSONE (DELTASONE) 20 MG tablet  Daily     09/22/18 1428           Eber Hong, MD 09/22/18 1429

## 2021-11-25 ENCOUNTER — Encounter (HOSPITAL_COMMUNITY): Payer: Self-pay

## 2021-11-25 ENCOUNTER — Emergency Department (HOSPITAL_COMMUNITY)
Admission: EM | Admit: 2021-11-25 | Discharge: 2021-11-25 | Disposition: A | Discharge status: Home / Self Care | Payer: Self-pay | Attending: Emergency Medicine | Admitting: Emergency Medicine

## 2021-11-25 ENCOUNTER — Emergency Department (HOSPITAL_COMMUNITY): Payer: Self-pay

## 2021-11-25 ENCOUNTER — Other Ambulatory Visit: Payer: Self-pay

## 2021-11-25 DIAGNOSIS — F17210 Nicotine dependence, cigarettes, uncomplicated: Secondary | ICD-10-CM | POA: Insufficient documentation

## 2021-11-25 DIAGNOSIS — S41112A Laceration without foreign body of left upper arm, initial encounter: Secondary | ICD-10-CM | POA: Insufficient documentation

## 2021-11-25 MED ORDER — LIDOCAINE HCL (PF) 1 % IJ SOLN
30.0000 mL | Freq: Once | INTRAMUSCULAR | Status: AC
  Administered 2021-11-25: 03:00:00 30 mL
  Filled 2021-11-25: qty 30

## 2021-11-25 MED ORDER — OXYCODONE HCL 5 MG PO TABS: 5.0000 mg | ORAL_TABLET | Freq: Once | ORAL | Status: DC

## 2021-11-25 MED ORDER — HYDROMORPHONE HCL 1 MG/ML IJ SOLN
1.0000 mg | Freq: Once | INTRAMUSCULAR | Status: DC
  Administered 2021-11-25: 02:00:00 1 mg via INTRAMUSCULAR
  Filled 2021-11-25: qty 1

## 2021-11-25 NOTE — Discharge Instructions (Signed)
You were evaluated in the Emergency Department and after careful evaluation, we did not find any emergent condition requiring admission or further testing in the hospital.  Your exam/testing today was overall reassuring.  X-rays did not show any broken bones or emergencies.  We repaired your laceration here in the emergency department.  Your stitches will need to be removed in 7 to 10 days by healthcare professional.  Please return to the Emergency Department if you experience any worsening of your condition.  Thank you for allowing [place] to be a part of your care.
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
## 2022-04-19 ENCOUNTER — Other Ambulatory Visit: Payer: Self-pay

## 2022-04-19 ENCOUNTER — Encounter (HOSPITAL_COMMUNITY): Payer: Self-pay

## 2022-04-19 ENCOUNTER — Emergency Department (HOSPITAL_COMMUNITY)
Admission: EM | Admit: 2022-04-19 | Discharge: 2022-04-20 | Disposition: A | Discharge status: Home / Self Care | Payer: Self-pay | Attending: Emergency Medicine | Admitting: Emergency Medicine

## 2022-04-19 DIAGNOSIS — F17210 Nicotine dependence, cigarettes, uncomplicated: Secondary | ICD-10-CM | POA: Insufficient documentation

## 2022-04-19 DIAGNOSIS — K0889 Other specified disorders of teeth and supporting structures: Secondary | ICD-10-CM | POA: Insufficient documentation

## 2022-04-19 NOTE — ED Triage Notes (Signed)
Pt c/o left lower dental pain x1 day. Took tylenol at home pta with no relief  ?

## 2022-04-20 MED ORDER — AMOXICILLIN 500 MG PO CAPS: 500.0000 mg | ORAL_CAPSULE | Freq: Three times a day (TID) | ORAL | 0 refills | Status: AC

## 2022-04-20 MED ORDER — NAPROXEN 500 MG PO TABS: 500.0000 mg | ORAL_TABLET | Freq: Two times a day (BID) | ORAL | 0 refills | Status: DC

## 2022-04-20 MED ORDER — OXYCODONE HCL 5 MG PO TABS
5.0000 mg | ORAL_TABLET | Freq: Once | ORAL | Status: AC
  Administered 2022-04-20: 5 mg via ORAL
  Filled 2022-04-20: qty 1

## 2022-04-20 MED ORDER — AMOXICILLIN 250 MG PO CAPS
500.0000 mg | ORAL_CAPSULE | Freq: Once | ORAL | Status: AC
  Administered 2022-04-20: 500 mg via ORAL
  Filled 2022-04-20: qty 2

## 2022-04-20 NOTE — Discharge Instructions (Signed)
You were evaluated in the Emergency Department and after careful evaluation, we did not find any emergent condition requiring admission or further testing in the hospital. ? ?Your exam/testing today is overall reassuring.  Symptoms seem to be due to a tooth infection.  Take the amoxicillin antibiotic as directed.  Use the Naprosyn twice daily for pain.  Follow-up with a dentist. ? ?Please return to the Emergency Department if you experience any worsening of your condition.   Thank you for allowing [place] to be a part of your care. ?

## 2022-04-20 NOTE — ED Provider Notes (Signed)
?[facility]-EMERGENCY DEPT ?[facility] Emergency Department ?Provider Note ?MRN:  [id number]  ?Arrival date & time: 04/20/22    ? ?Chief Complaint   ?Dental Pain ?  ?History of Present Illness   ?Jerome Rivera is a 31 y.o. year-old male with no pertinent past medical history presenting to the ED with chief complaint of dental pain. ? ?Pain to the left lower molar for the past 1 or 2 days, cannot sleep tonight due to the pain.  No fever, no other complaints. ? ?Review of Systems  ?A thorough review of systems was obtained and all systems are negative except as noted in the HPI and PMH.  ? ?Patient's Health History   ?History reviewed. No pertinent past medical history.  ?History reviewed. No pertinent surgical history.  ?Family History  ?Problem Relation Age of Onset  ? Arthritis Mother   ? Diabetes Father   ?  ?Social History  ? ?Socioeconomic History  ? Marital status: Single  ?  Spouse name: Not on file  ? Number of children: Not on file  ? Years of education: Not on file  ? Highest education level: Not on file  ?Occupational History  ? Not on file  ?Tobacco Use  ? Smoking status: Every Day  ?  Packs/day: 0.50  ?  Types: Cigarettes  ? Smokeless tobacco: Never  ?Substance and Sexual Activity  ? Alcohol use: No  ? Drug use: No  ? Sexual activity: Not on file  ?Other Topics Concern  ? Not on file  ?Social History Narrative  ? Not on file  ? ?Social Determinants of Health  ? ?Financial Resource Strain: Not on file  ?Food Insecurity: Not on file  ?Transportation Needs: Not on file  ?Physical Activity: Not on file  ?Stress: Not on file  ?Social Connections: Not on file  ?Intimate Partner Violence: Not on file  ?  ? ?Physical Exam  ? ?Vitals:  ? 04/19/22 2351  ?BP: (!) 151/90  ?Pulse: 75  ?Resp: 20  ?Temp: 99.3 ?F (37.4 ?C)  ?SpO2: 100%  ?  ?CONSTITUTIONAL: Well-appearing, in moderate distress due to pain ?NEURO/PSYCH:  Alert and oriented x 3, no focal deficits ?EYES:  eyes equal and reactive ?ENT/NECK:  no LAD,  no JVD ?CARDIO: Regular rate, well-perfused, normal S1 and S2 ?PULM:  CTAB no wheezing or rhonchi ?GI/GU:  non-distended, non-tender ?MSK/SPINE:  No gross deformities, no edema ?SKIN:  no rash, atraumatic ? ? ?*Additional and/or pertinent findings included in MDM below ? ?Diagnostic and Interventional Summary  ? ? EKG Interpretation ? ?Date/Time:    ?Ventricular Rate:    ?PR Interval:    ?QRS Duration:   ?QT Interval:    ?QTC Calculation:   ?R Axis:     ?Text Interpretation:   ?  ? ?  ? ?Labs Reviewed - No data to display  ?No orders to display  ?  ?Medications  ?amoxicillin (AMOXIL) capsule 500 mg (500 mg Oral Given 04/20/22 0007)  ?oxyCODONE (Oxy IR/ROXICODONE) immediate release tablet 5 mg (5 mg Oral Given 04/20/22 0008)  ?  ? ?Procedures  /  Critical Care ?Procedures ? ?ED Course and Medical Decision Making  ?Initial Impression and Ddx ?Exam overall reassuring, dental cavity to the tender molar but no signs of adjacent abscess or more significant infection, normal vital signs.  Dental block offered but declined, will provide single dose oxycodone here, provide prescription for antibiotics, anti-inflammatories. ? ?Past medical/surgical history that increases complexity of ED encounter: None ? ?Interpretation of  Diagnostics ?Not applicable ? ?Patient Reassessment and Ultimate Disposition/Management ?Discharge ? ?Patient management required discussion with the following services or consulting groups:  None ? ?Complexity of Problems Addressed ?Acute complicated illness or Injury ? ?Additional Data Reviewed and Analyzed ?Further history obtained from: ?Further history from spouse/family member ? ?Additional Factors Impacting ED Encounter Risk ?Prescriptions ? ?Elmer Sow. Pilar Plate, MD ?[facility] Emergency Medicine ?[facility] [email or web address] ? ?Final Clinical Impressions(s) / ED Diagnoses  ? ?  ICD-10-CM   ?1. Pain, dental  K08.89   ?  ?  ?ED Discharge Orders   ? ?      Ordered  ?  naproxen  (NAPROSYN) 500 MG tablet  2 times daily       ? 04/20/22 0036  ?  amoxicillin (AMOXIL) 500 MG capsule  3 times daily       ? 04/20/22 0036  ? ?  ?  ? ?  ?  ? ?Discharge Instructions Discussed with and Provided to Patient:  ? ? ?Discharge Instructions   ? ?  ?You were evaluated in the Emergency Department and after careful evaluation, we did not find any emergent condition requiring admission or further testing in the hospital. ? ?Your exam/testing today is overall reassuring.  Symptoms seem to be due to a tooth infection.  Take the amoxicillin antibiotic as directed.  Use the Naprosyn twice daily for pain.  Follow-up with a dentist. ? ?Please return to the Emergency Department if you experience any worsening of your condition.   Thank you for allowing [place] to be a part of your care. ? ? ? ?  ?Sabas Sous, MD ?04/20/22 0038 ? ?
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
## 2024-04-17 ENCOUNTER — Emergency Department (HOSPITAL_COMMUNITY)

## 2024-04-17 ENCOUNTER — Emergency Department (HOSPITAL_COMMUNITY)
Admission: EM | Admit: 2024-04-17 | Discharge: 2024-04-17 | Disposition: A | Discharge status: Home / Self Care | Attending: Emergency Medicine | Admitting: Emergency Medicine

## 2024-04-17 ENCOUNTER — Encounter (HOSPITAL_COMMUNITY): Payer: Self-pay

## 2024-04-17 ENCOUNTER — Other Ambulatory Visit: Payer: Self-pay

## 2024-04-17 DIAGNOSIS — M79642 Pain in left hand: Secondary | ICD-10-CM | POA: Diagnosis present

## 2024-04-17 DIAGNOSIS — M25532 Pain in left wrist: Secondary | ICD-10-CM | POA: Diagnosis not present

## 2024-04-17 MED ORDER — IBUPROFEN 800 MG PO TABS: 800.0000 mg | ORAL_TABLET | Freq: Three times a day (TID) | ORAL | 0 refills | Status: DC | PRN

## 2024-04-17 MED ORDER — IBUPROFEN 800 MG PO TABS
800.0000 mg | ORAL_TABLET | Freq: Once | ORAL | Status: AC
  Administered 2024-04-17: 800 mg via ORAL
  Filled 2024-04-17: qty 1

## 2024-04-17 NOTE — Discharge Instructions (Addendum)
 Your x-ray did not show any sign of fracture.  Please use the wrist splint to rest the area.  Ice may also help.  Ibuprofen  3 times a day with food on your stomach.  Follow-up with Dr. Phyllis Breeze.

## 2024-04-17 NOTE — ED Triage Notes (Signed)
 Pt c/o left hand pain. Pt reports wake up with it "cramping."

## 2024-04-17 NOTE — ED Provider Notes (Signed)
 [place] EMERGENCY DEPARTMENT AT [facility] Provider Note   CSN: [id number] Arrival date & time: 04/17/24  4098     History  Chief Complaint  Patient presents with   Hand Pain    Jerome Rivera is a 33 y.o. male.  He is here with complaint of left hand and wrist pain that is been going on for 2 days.  Pain is there at rest and is worse with movement.  He says it feels like a cramp.  He has tried Tylenol  without improvement.  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  He does not recall any trauma.  No numbness or weakness.  He has no significant medical history  The history is provided by the patient.  Hand Pain This is a new problem. The current episode started 2 days ago. The problem occurs constantly. The problem has not changed since onset.Pertinent negatives include no chest pain, no abdominal pain, no headaches and no shortness of breath. The symptoms are aggravated by bending and twisting. Nothing relieves the symptoms. He has tried acetaminophen  for the symptoms. The treatment provided no relief.       Home Medications Prior to Admission medications   Medication Sig Start Date End Date Taking? Authorizing Provider  HYDROcodone -acetaminophen  (NORCO/VICODIN) 5-325 MG tablet Take 1 tablet by mouth every 4 (four) hours as needed for moderate pain (Must last 14 days.  Do not take and drive a car or use machinery.). 08/25/16   Pleasant Brilliant, MD  naproxen  (NAPROSYN ) 500 MG tablet Take 1 tablet (500 mg total) by mouth 2 (two) times daily. 04/20/22   Edson Graces, MD  predniSONE  (DELTASONE ) 20 MG tablet Take 2 tablets (40 mg total) by mouth daily. 09/22/18   Early Glisson, MD      Allergies    Patient has no known allergies.    Review of Systems   Review of Systems  Respiratory:  Negative for shortness of breath.   Cardiovascular:  Negative for chest pain.  Gastrointestinal:  Negative for abdominal pain.  Neurological:  Negative for  headaches.    Physical Exam Updated Vital Signs Ht 5\' 6"  (1.676 m)   Wt 79.4 kg   BMI 28.25 kg/m  Physical Exam Vitals and nursing note reviewed.  Constitutional:      Appearance: He is well-developed.  HENT:     Head: Normocephalic and atraumatic.  Eyes:     Conjunctiva/sclera: Conjunctivae normal.  Pulmonary:     Effort: Pulmonary effort is normal.  Musculoskeletal:        General: Tenderness present. No swelling, deformity or signs of injury. Normal range of motion.     Cervical back: Neck supple.     Comments: He has diffuse tenderness in his hand primarily over the thenar eminence and wrist.  There is no open wounds.  Radial pulse 2+.  Motor and sensory distal intact.  Cap refill brisk.  Compartments appear soft.  Overlying skin changes  Skin:    General: Skin is warm and dry.  Neurological:     General: No focal deficit present.     Mental Status: He is alert.     GCS: GCS eye subscore is 4. GCS verbal subscore is 5. GCS motor subscore is 6.     Sensory: No sensory deficit.     Motor: No weakness.     ED Results / Procedures / Treatments   Labs (all labs ordered are listed, but only abnormal results are  displayed) Labs Reviewed - No data to display  EKG None  Radiology DG Hand Complete Left Result Date: 04/17/2024 EXAM: 3 OR MORE VIEW(S) XRAY OF THE LEFT HAND 04/17/2024 08:01:00 AM COMPARISON: None available. CLINICAL HISTORY: Pain with movement. Patient complains of left hand pain for a couple of days. Patient reports waking up with it "cramping." FINDINGS: BONES AND JOINTS: No acute fracture. No focal osseous lesion. No joint dislocation. SOFT TISSUES: The soft tissues are unremarkable. IMPRESSION: 1. No acute osseous abnormality. Electronically signed by: Karlyn Overman MD 04/17/2024 08:18 AM EDT RP Workstation: [id number]   DG Wrist Complete Left Result Date: 04/17/2024 EXAM: 3 OR MORE VIEW(S) XRAY OF THE LEFT WRIST 04/17/2024 08:01:00 AM COMPARISON: None  available. CLINICAL HISTORY: Pain with movement. Patient complains of left hand pain for a couple of days. Patient reports waking up with it "cramping." FINDINGS: BONES AND JOINTS: No acute fracture. No focal osseous lesion. No joint dislocation. SOFT TISSUES: The soft tissues are unremarkable. IMPRESSION: 1. No acute osseous abnormality. Electronically signed by: Karlyn Overman MD 04/17/2024 08:16 AM EDT RP Workstation: [id number]    Procedures Procedures    Medications Ordered in ED Medications  ibuprofen  (ADVIL ) tablet 800 mg (800 mg Oral Given 04/17/24 0942)    ED Course/ Medical Decision Making/ A&P Clinical Course as of 04/17/24 1649  Wed Apr 17, 2024  0832 X-rays interpreted by me as no acute fracture or dislocation.  Reviewed with patient.  Will put in splint and start on NSAIDs.  Given hand outpatient follow-up contact information [MB]    Clinical Course User Index [MB] Tonya Fredrickson, MD                                 Medical Decision Making Amount and/or Complexity of Data Reviewed Radiology: ordered.  Risk Prescription drug management.   This patient complains of left hand and wrist pain; this involves an extensive number of treatment Options and is a complaint that carries with it a high risk of complications and morbidity. The differential includes fracture, contusion, compartment syndrome, carpal tunnel, radiculopathy, tendinitis  I ordered medication oral ibuprofen  and reviewed PMP when indicated. I ordered imaging studies which included x-rays hand and wrist and I independently    visualized and interpreted imaging which showed no acute findings Previous records obtained and reviewed in epic no recent admissions Social determinants considered, tobacco use Critical Interventions: None  After the interventions stated above, I reevaluated the patient and found patient be neurovascularly intact Admission and further testing considered, no indications for  admission.  Will start with splinting and anti-inflammatories, recommended hand outpatient follow-up.  Return instructions discussed         Final Clinical Impression(s) / ED Diagnoses Final diagnoses:  Left hand pain  Left wrist pain    Rx / DC Orders ED Discharge Orders          Ordered    ibuprofen  (ADVIL ) 800 MG tablet  Every 8 hours PRN        04/17/24 0834              Tonya Fredrickson, MD 04/17/24 1650

## 2024-04-22 ENCOUNTER — Ambulatory Visit: Admitting: Orthopedic Surgery

## 2024-04-22 ENCOUNTER — Encounter: Payer: Self-pay | Admitting: Orthopedic Surgery

## 2024-04-22 VITALS — BP 133/87 | HR 80 | Ht 66.0 in | Wt 171.0 lb

## 2024-04-22 DIAGNOSIS — M654 Radial styloid tenosynovitis [de Quervain]: Secondary | ICD-10-CM

## 2024-04-22 DIAGNOSIS — M778 Other enthesopathies, not elsewhere classified: Secondary | ICD-10-CM

## 2024-04-22 DIAGNOSIS — M25532 Pain in left wrist: Secondary | ICD-10-CM | POA: Diagnosis not present

## 2024-04-22 MED ORDER — PREDNISONE 10 MG (48) PO TBPK: ORAL_TABLET | ORAL | 0 refills | Status: DC

## 2024-04-22 NOTE — Patient Instructions (Signed)
 No work 30 days   Take 12 days of prednisone  then resume ibuprofen  800 mg 3 x a day withy food  Apply ice to wrist 20 min 3 x  a day   Wear brace 30 days   See hand surgeon

## 2024-04-22 NOTE — Progress Notes (Signed)
 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  Constitutional:      Appearance: Normal appearance.  HENT:     Head: Normocephalic and atraumatic.  Eyes:     General: No scleral icterus.       Right eye: No discharge.        Left eye: No discharge.     Extraocular Movements: Extraocular movements intact.     Conjunctiva/sclera: Conjunctivae normal.     Pupils: Pupils are equal, round, and reactive to light.  Cardiovascular:     Rate and Rhythm: Normal rate.     Pulses: Normal pulses.  Musculoskeletal:     Comments: Examination of the left wrist shows the patient holding the wrist in slight flexion and his finger slightly flexed it is difficult to move his thumb he has tenderness over the base of the thumb somewhat into the first extensor compartment and across the palm in the thenar area.  I can passively flex the IP joints and MP joints of all 4 lesser digits but it is very painful for him to flex the thumb in any direction.  No swelling.  No warmth.  Skin intact.  Sensation normal.  Skin:    General: Skin is warm and dry.     Capillary Refill: Capillary refill takes less than 2 seconds.  Neurological:     General: No focal deficit present.     Mental Status: He is alert and oriented to person, place, and  time.  Psychiatric:        Mood and Affect: Mood normal.        Behavior: Behavior normal.        Thought Content: Thought content normal.        Judgment: Judgment normal.    The first set of images I read wrist no fracture  Second second images I read hand no fracture   Differential diagnosis   Encounter Diagnoses  Name Primary?   Pain in left wrist Yes   De Quervain's tenosynovitis, left    Tendinitis of left wrist     Recommend  Prednisone  Dosepak Encounter Diagnoses  Name Primary?   Pain in left wrist Yes   De Quervain's tenosynovitis, left    Tendinitis of left wrist    Wrist splint  Out of work 30 days  Meds ordered this encounter  Medications   predniSONE  (STERAPRED UNI-PAK 48 TAB) 10 MG (48) TBPK tablet    Sig: 10 mg da 12 days    Dispense:  48 tablet    Refill:  0   See hand surgeon for definitive management I am not quite sure what this is

## 2024-04-22 NOTE — Progress Notes (Signed)
  Intake history:  BP 133/87   Pulse 80   Ht 5\' 6"  (1.676 m)   Wt 171 lb (77.6 kg)   BMI 27.60 kg/m  Body mass index is 27.6 kg/m.    WHAT ARE WE SEEING YOU FOR TODAY?   left hand(s)  How Jerome Rivera has this bothered you? (DOI?DOS?WS?)  2 week(s) ago  Anticoag.  No  Diabetes No  Heart disease No  Hypertension No  SMOKING HX Yes  Kidney disease No  Any ALLERGIES ______________________________________________   Treatment:  Have you taken:  Tylenol  Yes  Advil  Yes  Had PT No  Had injection No  Other  _________________________

## 2024-05-14 ENCOUNTER — Ambulatory Visit (INDEPENDENT_AMBULATORY_CARE_PROVIDER_SITE_OTHER): Admitting: Orthopedic Surgery

## 2024-05-14 DIAGNOSIS — M654 Radial styloid tenosynovitis [de Quervain]: Secondary | ICD-10-CM

## 2024-05-14 NOTE — Progress Notes (Unsigned)
 Jerome Rivera - 33 y.o. male MRN [id number]  Date of birth: 27-Oct-1991  Office Visit Note: Visit Date: 05/14/2024 PCP: Patient, No Pcp Per Referred by: Darrin Emerald, MD  Subjective: No chief complaint on file.  HPI: Jerome Rivera is a pleasant 33 y.o. male who presents today for as a referral for specific hand surgical evaluation given history of prior left radial sided wrist pain.  He works as a [employer] fast streamer, felt that his wrist pain was development from some overuse over the past few months.  States that with some activity modification and time this has improved.  He was also given a Medrol Dosepak by Dr. Phyllis Breeze which helped alleviate some inflammatory symptoms.  Currently, his pain is well-controlled, his range of motion has improved as well as the strength in the left hand and wrist region.  Pertinent ROS were reviewed with the patient and found to be negative unless otherwise specified above in HPI.   Visit Reason: left radial sided wrist pain Duration of symptoms: 1-2 months Hand dominance: right Occupation: delivery driver Diabetic: No Smoking: No Heart/Lung History:none Blood Thinners: none  Prior Testing/EMG:none Injections (Date):none Treatments:tried dose pak on 5/19 Prior Surgery:none  Assessment & Plan: Visit Diagnoses:  1. De Quervain's tenosynovitis, left     Plan: Clinically, he is demonstrating minimal symptoms today.  His story and prior examination findings are consistent with de Quervain's tenosynovitis.  I am glad to see that his symptoms have resolved with anti-inflammatory medication, activity modification and time.  I did explain to him the underlying etiology and pathophysiology of this condition as well as the possibility for recurrence that may require further intervention.  We discussed conservative versus surgical treat modalities.  From a conservative standpoint, we discussed bracing, injection, therapy, activity modification  as well as anti-inflammatory medication.  From a surgical standpoint, I did explain to him that should symptoms remain refractory to conservative care, would consider for extensor compartment release in the future.  Given his minimal symptoms now, I do not see any need for further intervention.  He is welcome to return to me as needed moving forward.  Work note was provided today, no restrictions moving forward at this time.     Follow-up: No follow-ups on file.   Meds & Orders: No orders of the defined types were placed in this encounter.  No orders of the defined types were placed in this encounter.    Procedures: No procedures performed      Clinical History: No specialty comments available.  He reports that he has been smoking cigarettes. He has never used smokeless tobacco. No results for input(s): HGBA1C, LABURIC in the last 8760 hours.  Objective:   Vital Signs: There were no vitals taken for this visit.  Physical Exam  Gen: Well-appearing, in no acute distress; non-toxic CV: Regular Rate. Well-perfused. Warm.  Resp: Breathing unlabored on room air; no wheezing. Psych: Fluid speech in conversation; appropriate affect; normal thought process  Ortho Exam General: Patient is well appearing and in no distress. Cervical spine mobility is full in all directions:  Skin and Muscle: No skin changes are apparent to upper extremities.  Muscle bulk and contour normal, no signs of atrophy.     Range of Motion and Palpation Tests: Mobility is full about the elbows with flexion and extension.  Forearm supination and pronation are 85/85 bilaterally.  Wrist flexion/extension is 75/65 bilaterally.  Digital flexion and extension are full.    No cords  or nodules are palpated.  No triggering is observed.   Left wrist:  Finklestein test is negative, no significant pain without associated swelling and tenderness.    Neurologic, Vascular, Motor: Sensation is intact to light touch in the  median/radial/ulnar distributions.   Fingers pink and well perfused.  Capillary refill is brisk.     Imaging: No results found. Prior x-rays of the hand and wrist were reviewed  Past Medical/Family/Surgical/Social History: Medications & Allergies reviewed per EMR, new medications updated. Patient Active Problem List   Diagnosis Date Noted   Closed fracture of metacarpal bone 09/29/2010   CLOS FRACTURE MID/PROXIMAL PHALANX/PHALANG HAND 09/29/2010   CLOSED FRACTURE OF BASE OF OTHER METACARPAL BONE 04/10/2008   No past medical history on file. Family History  Problem Relation Age of Onset   Arthritis Mother    Diabetes Father    No past surgical history on file. Social History   Occupational History   Not on file  Tobacco Use   Smoking status: Every Day    Current packs/day: 0.50    Types: Cigarettes   Smokeless tobacco: Never  Substance and Sexual Activity   Alcohol use: Yes   Drug use: No   Sexual activity: Not on file    [place] Merlinda Starling) Marce Sensing, M.D. [facility], Hand Surgery

## 2024-05-15 ENCOUNTER — Ambulatory Visit: Admitting: Orthopedic Surgery

## 2024-08-27 ENCOUNTER — Other Ambulatory Visit: Payer: Self-pay

## 2024-08-27 ENCOUNTER — Encounter: Payer: Self-pay | Admitting: Emergency Medicine

## 2024-08-27 ENCOUNTER — Emergency Department
Admission: EM | Admit: 2024-08-27 | Discharge: 2024-08-27 | Disposition: A | Discharge status: Home / Self Care | Payer: Self-pay | Attending: Emergency Medicine | Admitting: Emergency Medicine

## 2024-08-27 DIAGNOSIS — K0401 Reversible pulpitis: Secondary | ICD-10-CM | POA: Insufficient documentation

## 2024-08-27 MED ORDER — IBUPROFEN 600 MG PO TABS: 600.0000 mg | ORAL_TABLET | Freq: Three times a day (TID) | ORAL | 0 refills | Status: AC | PRN

## 2024-08-27 MED ORDER — IBUPROFEN 600 MG PO TABS
600.0000 mg | ORAL_TABLET | Freq: Once | ORAL | Status: AC
  Administered 2024-08-27: 600 mg via ORAL
  Filled 2024-08-27: qty 1

## 2024-08-27 MED ORDER — AMOXICILLIN-POT CLAVULANATE 875-125 MG PO TABS
1.0000 | ORAL_TABLET | Freq: Once | ORAL | Status: AC
Start: 2024-08-27 — End: 2024-08-27
  Administered 2024-08-27: 1 via ORAL
  Filled 2024-08-27: qty 1

## 2024-08-27 MED ORDER — AMOXICILLIN-POT CLAVULANATE 875-125 MG PO TABS: 1.0000 | ORAL_TABLET | Freq: Two times a day (BID) | ORAL | 0 refills | Status: AC

## 2024-08-27 NOTE — Discharge Instructions (Addendum)
 You can take Tylenol  1 g every 8 hours for pain for one week as long as you do not drink alcohol or have any liver issues.  You can use the ibuprofen  with food.  Take the antibiotics and follow-up with a dentist. Return for fevers, worsening symptoms or any other concern  OPTIONS FOR DENTAL FOLLOW UP CARE  [place] [facility] of Health and Human Services - Local Safety Net Dental Clinics [email or web address]   [facility] [phone])  Norita Goldberg [phone])  [place] [phone] ext [phone])  [facility] [phone])  [facility] [phone]) This clinic caters to the indigent population and is on a lottery system. Location: [employer] of Dentistry, [employer], [address], [place] Clinic Hours: Wednesdays from 6pm - 9pm, patients seen by a lottery system. For dates, call or go to [email or web address] Services: Cleanings, fillings and simple extractions. Payment Options: DENTAL WORK IS FREE OF CHARGE. Bring proof of income or support. Best way to get seen: Arrive at 5:15 pm - this is a lottery, NOT first come/first serve, so arriving earlier will not increase your chances of being seen.     [facility] [phone] Select option 1 for emergencies   Location: [facility]istry, [place], [address] [place] Clinic Hours: No walk-ins accepted - call the day before to schedule an appointment. Check in times are 9:30 am and 1:30 pm. Services: Simple extractions, temporary fillings, pulpectomy/pulp debridement, uncomplicated abscess drainage. Payment Options: PAYMENT IS DUE AT THE TIME OF SERVICE.  Fee is usually $100-200, additional surgical procedures (e.g. abscess drainage) may be extra. Cash, checks, Visa/MasterCard accepted.  Can file Medicaid if patient is covered for dental - patient  should call case worker to check. No discount for [facility] patients. Best way to get seen: MUST call the day before and get onto the schedule. Can usually be seen the next 1-2 days. No walk-ins accepted.     [facility] [phone]   Location: [facility], [address] [place] Clinic Hours: M, W, Th, F 8am or 1:30pm, Tues 9a or 1:30 - first come/first served. Services: Simple extractions, temporary fillings, uncomplicated abscess drainage.  You do not need to be an [facility] resident. Payment Options: PAYMENT IS DUE AT THE TIME OF SERVICE. Dental insurance, otherwise sliding scale - bring proof of income or support. Depending on income and treatment needed, cost is usually $50-200. Best way to get seen: Arrive early as it is first come/first served.     [facility] [phone]   Location: [address] Clinic Hours: Mon-Thu 8a-5p Services: Most basic dental services including extractions and fillings. Payment Options: PAYMENT IS DUE AT THE TIME OF SERVICE. Sliding scale, up to 50% off - bring proof if income or support. Medicaid with dental option accepted. Best way to get seen: Call to schedule an appointment, can usually be seen within 2 weeks OR they will try to see walk-ins - show up at 8a or 2p (you may have to wait).     [facility] [phone] [place] RESIDENTS ONLY   Location: [facility], [address] [place], [place] [address] Clinic Hours: By appointment only. Monday - Thursday 8am-5pm, Friday 8am-12pm Services: Cleanings, fillings, extractions. Payment Options: PAYMENT IS DUE AT THE TIME OF SERVICE. Cash, Visa or MasterCard. Sliding scale - $30 minimum per service. Best way to  get seen: Come in to office, complete packet and make an appointment - need proof of income or support monies for each household member  and proof of [facility] residence. Usually takes about a month to get in.     [facility] [phone]   Location: [address] [facility] Clinic Hours: Walk-in Urgent Care Dental Services are offered Monday-Friday mornings only. The numbers of emergencies accepted daily is limited to the number of providers available. Maximum 15 - Mondays, Wednesdays & Thursdays Maximum 10 - Tuesdays & Fridays Services: You do not need to be a [facility] resident to be seen for a dental emergency. Emergencies are defined as pain, swelling, abnormal bleeding, or dental trauma. Walkins will receive x-rays if needed. NOTE: Dental cleaning is not an emergency. Payment Options: PAYMENT IS DUE AT THE TIME OF SERVICE. Minimum co-pay is $40.00 for uninsured patients. Minimum co-pay is $3.00 for Medicaid with dental coverage. Dental Insurance is accepted and must be presented at time of visit. Medicare does not cover dental. Forms of payment: Cash, credit card, checks. Best way to get seen: If not previously registered with the clinic, walk-in dental registration begins at 7:15 am and is on a first come/first serve basis. If previously registered with the clinic, call to make an appointment.     [facility] [phone] [place] RESIDENTS ONLY   Location: [address], [place], [place] Clinic Hours: Mon-Thu 10a-2p Services: Extractions only! Payment Options: FREE (donations accepted) - bring proof of income or support Best way to get seen: Call and schedule an appointment OR come at 8am on the 1st Monday of every month (except for holidays) when it is first come/first served.     [facility] [phone]   Location: [address] [place], [place] Clinic Hours: Friday mornings Services, Payment Options, Best way to get seen: Call for info

## 2024-08-27 NOTE — ED Provider Notes (Signed)
   [facility] Provider Note    Event Date/Time   First MD Initiated Contact with Patient 08/27/24 2140     (approximate)   History   Dental Pain   HPI  Jerome Rivera is a 33 y.o. male who comes in with right upper dental pain for the past couple of days.  Patient reports that he has had issues with this tooth that has been fractured.  It is his right upper tooth.  He reports increasing pain over the past couple of days.  He is not followed up with a dentist.  Denies any fevers, swelling.  States that he feels like he just needs some antibiotics    Physical Exam   Triage Vital Signs: ED Triage Vitals [08/27/24 2019]  Encounter Vitals Group     BP (!) 136/101     Girls Systolic BP Percentile      Girls Diastolic BP Percentile      Boys Systolic BP Percentile      Boys Diastolic BP Percentile      Pulse Rate 87     Resp 18     Temp 98.4 F (36.9 C)     Temp Source Oral     SpO2 96 %     Weight 175 lb (79.4 kg)     Height 5' 6 (1.676 m)     Head Circumference      Peak Flow      Pain Score 10     Pain Loc      Pain Education      Exclude from Growth Chart     Most recent vital signs: Vitals:   08/27/24 2019  BP: (!) 136/101  Pulse: 87  Resp: 18  Temp: 98.4 F (36.9 C)  SpO2: 96%     General: Awake, no distress.  CV:  Good peripheral perfusion.  Resp:  Normal effort.  Abd:  No distention.  Other:  Fractured tooth on the upper right.  No obvious abscess felt.  No swelling of the face   ED Results / Procedures / Treatments   Labs (all labs ordered are listed, but only abnormal results are displayed) Labs Reviewed - No data to display   EKG  My interpretation of EKG:    RADIOLOGY I have reviewed the xray personally and interpreted    PROCEDURES:  Critical Care performed: No  Procedures   MEDICATIONS ORDERED IN ED: Medications - No data to display   IMPRESSION / MDM / ASSESSMENT AND PLAN / ED COURSE  I  reviewed the triage vital signs and the nursing notes.   Patient's presentation is most consistent with acute, uncomplicated illness.   Patient has a fractured tooth that I suspect some pulpitis versus cavity.  Will start on antibiotics and discussed Tylenol , ibuprofen  for pain and the importance of following up with a dentist as these are going to be limited in improving patient's symptoms.  He expressed understanding and felt comfortable with this plan.  Do not see evidence of abscess at this   FINAL CLINICAL IMPRESSION(S) / ED DIAGNOSES   Final diagnoses:  Pulpitis     Rx / DC Orders   ED Discharge Orders     None        Note:  This document was prepared using Dragon voice recognition software and may include unintentional dictation errors.   Ernest Ronal BRAVO, MD 08/27/24 2152

## 2024-08-27 NOTE — ED Notes (Signed)
 ED Provider at bedside.

## 2024-08-27 NOTE — ED Triage Notes (Signed)
 Pt arrives POV, ambulatory to triage c/o right upper dental pain x couple days

## 2024-12-31 ENCOUNTER — Encounter (HOSPITAL_COMMUNITY): Payer: Self-pay | Admitting: *Deleted

## 2024-12-31 ENCOUNTER — Emergency Department (HOSPITAL_COMMUNITY)
Admission: EM | Admit: 2024-12-31 | Discharge: 2024-12-31 | Disposition: A | Discharge status: Home / Self Care | Payer: Self-pay | Attending: Emergency Medicine | Admitting: Emergency Medicine

## 2024-12-31 ENCOUNTER — Other Ambulatory Visit: Payer: Self-pay

## 2024-12-31 DIAGNOSIS — M436 Torticollis: Secondary | ICD-10-CM | POA: Insufficient documentation

## 2024-12-31 MED ORDER — METHOCARBAMOL 500 MG PO TABS: 500.0000 mg | ORAL_TABLET | Freq: Three times a day (TID) | ORAL | 0 refills | Status: AC

## 2024-12-31 MED ORDER — NAPROXEN 500 MG PO TABS: 500.0000 mg | ORAL_TABLET | Freq: Two times a day (BID) | ORAL | 0 refills | Status: AC

## 2024-12-31 MED ORDER — HYDROCODONE-ACETAMINOPHEN 5-325 MG PO TABS: ORAL_TABLET | ORAL | 0 refills | Status: AC
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
## 2024-12-31 NOTE — ED Provider Notes (Signed)
 " [facility] AT [facility] Provider Note   CSN: [id number] Arrival date & time: 12/31/24  1039     Patient presents with: Neck Pain   Jerome Rivera is a 34 y.o. male.    Neck Pain      Jerome Rivera is a 34 y.o. male who presents to the Emergency Department complaining of right sided neck pain x 2 days.  States that his pipes were frozen and his right neck began hurting began after looking upward while underneath his house.  He has pain with rotation of his neck and pain is located to the right side of his neck into the top of the shoulder area.  Pain worse with movement, improves at rest.  Denies headache, dizziness, weakness of the extremities, chest pain or shortness of breath.    Prior to Admission medications  Medication Sig Start Date End Date Taking? Authorizing Provider  HYDROcodone -acetaminophen  (NORCO/VICODIN) 5-325 MG tablet Take 1 tablet by mouth every 4 (four) hours as needed for moderate pain (Must last 14 days.  Do not take and drive a car or use machinery.). 08/25/16   Brenna Lin, MD  naproxen  (NAPROSYN ) 500 MG tablet Take 1 tablet (500 mg total) by mouth 2 (two) times daily. 04/20/22   Theadore Ozell CHRISTELLA, MD  predniSONE  (DELTASONE ) 20 MG tablet Take 2 tablets (40 mg total) by mouth daily. 09/22/18   Cleotilde Rogue, MD  predniSONE  (STERAPRED UNI-PAK 48 TAB) 10 MG (48) TBPK tablet 10 mg da 12 days 04/22/24   Margrette Taft BRAVO, MD    Allergies: Patient has no known allergies.    Review of Systems  Gastrointestinal:  Negative for vomiting.  Musculoskeletal:  Positive for neck pain.  All other systems reviewed and are negative.   Updated Vital Signs BP (!) 149/94   Pulse 71   Temp 98.1 F (36.7 C)   Resp 15   Ht 5' 6 (1.676 m)   Wt 81.6 kg   SpO2 99%   BMI 29.05 kg/m   Physical Exam Vitals and nursing note reviewed.  Constitutional:      General: He is not in acute distress.    Appearance: Normal appearance.  He is not ill-appearing or toxic-appearing.  Neck:     Trachea: Phonation normal.     Comments: Ttp right lateral cervical paraspinal muscles.  No midline tenderness.  Cardiovascular:     Rate and Rhythm: Normal rate and regular rhythm.     Pulses: Normal pulses.  Pulmonary:     Effort: Pulmonary effort is normal.     Breath sounds: Normal breath sounds.  Chest:     Chest wall: No tenderness.  Musculoskeletal:        General: Tenderness and signs of injury present. No swelling. Normal range of motion.     Cervical back: Torticollis present. No edema or rigidity. Pain with movement and muscular tenderness present. Normal range of motion.     Comments: Grip strength is strong and symmetrical bilaterally  Skin:    General: Skin is warm.     Capillary Refill: Capillary refill takes less than 2 seconds.  Neurological:     General: No focal deficit present.     Mental Status: He is alert.     Sensory: No sensory deficit.     Motor: No weakness.     (all labs ordered are listed, but only abnormal results are displayed) Labs Reviewed - No data to display  EKG:  None  Radiology: No results found.   Procedures   Medications Ordered in the ED - No data to display                                  Medical Decision Making   Ttp localized to right neck that began after awkward movement, looking up while underneath a house.  Sx's x 2 days.  Pain reproduced with movement, improves at rest.  No chest pain or shortness of breath.  No radicular sx's, headache, dizziness or paraesthesia's  I suspect his sx's are MSK, cervical radiculopathy, and neurologic process also considered but doubtful.   he is well appearing, no indication for infectious process.     Amount and/or Complexity of Data Reviewed Discussion of management or test interpretation with external provider(s):   Suspect torticollis.  Will treat his sx's.  He will f/u with PCP or orthopedic if needed.  Return precautions  given  Risk Prescription drug management.        Final diagnoses:  Torticollis, acute    ED Discharge Orders     None          Herlinda Milling, PA-C 12/31/24 1404  "

## 2024-12-31 NOTE — Discharge Instructions (Signed)
 Alternate ice and heat to your neck.  Avoid straining or heavy lifting for 5-7 days.  Follow up with your PCP or with orthopedics in one week if not improved.
